# Patient Record
Sex: Male | Born: 1995 | Race: White | Hispanic: No | Marital: Single | Smoking: Former smoker
Health system: Southern US, Community
[De-identification: ages and names within clinical notes are randomized; demographics above are authoritative.]

## PROBLEM LIST (undated history)

## (undated) HISTORY — PX: ELBOW SURGERY: SHX618

---
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
## 2016-04-11 DIAGNOSIS — M25571 Pain in right ankle and joints of right foot: Secondary | ICD-10-CM | POA: Diagnosis not present

## 2016-04-11 DIAGNOSIS — M79671 Pain in right foot: Secondary | ICD-10-CM | POA: Diagnosis not present

## 2016-05-05 ENCOUNTER — Encounter: Payer: Self-pay | Admitting: Primary Care

## 2016-05-05 ENCOUNTER — Ambulatory Visit (INDEPENDENT_AMBULATORY_CARE_PROVIDER_SITE_OTHER): Payer: 59 | Admitting: Primary Care

## 2016-05-05 DIAGNOSIS — M79673 Pain in unspecified foot: Secondary | ICD-10-CM | POA: Insufficient documentation

## 2016-05-05 DIAGNOSIS — M79671 Pain in right foot: Secondary | ICD-10-CM | POA: Diagnosis not present

## 2016-05-05 NOTE — Progress Notes (Signed)
Pre visit review using our clinic review tool, if applicable. No additional management support is needed unless otherwise documented below in the visit note. 

## 2016-05-05 NOTE — Patient Instructions (Signed)
Your heel needs additional time to heal. Purchase some heel insoles to help cushion and protect your heels. Give this 2 more weeks and notify me if no improvement.  Start Ibuprofen. Take 600 mg three times daily as needed for pain and inflammation of your heel.  Nasal Congestion: Try using Flonase (fluticasone) nasal spray. Instill 1 spray in each nostril twice daily.   Cough: Try Delsym DM cough syrup. This will help with cough and any congestion.   Please notify me if you develop persistent fevers of 101, start coughing up green mucous, notice increased fatigue or weakness, or feel worse after 1 week of onset of symptoms.   Increase consumption of water intake and rest.  It was a pleasure to meet you today! Please don't hesitate to call me with any questions. Welcome to [facility]!
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
## 2016-06-09 DIAGNOSIS — H5213 Myopia, bilateral: Secondary | ICD-10-CM | POA: Diagnosis not present

## 2016-09-07 ENCOUNTER — Ambulatory Visit: Payer: 59 | Admitting: Primary Care

## 2017-02-05 DIAGNOSIS — H66001 Acute suppurative otitis media without spontaneous rupture of ear drum, right ear: Secondary | ICD-10-CM | POA: Diagnosis not present

## 2017-02-05 DIAGNOSIS — H60331 Swimmer's ear, right ear: Secondary | ICD-10-CM | POA: Diagnosis not present

## 2017-02-05 DIAGNOSIS — H6121 Impacted cerumen, right ear: Secondary | ICD-10-CM | POA: Diagnosis not present

## 2017-07-09 ENCOUNTER — Ambulatory Visit: Payer: Self-pay | Admitting: *Deleted

## 2017-07-09 NOTE — Telephone Encounter (Signed)
Pt reports red, raised area left groin. Area slightly larger than quarter size. Noted on Saturday 07/07/17. Tender "when bending" 3/10.  No drainage; states soft on top of affected area, hardened underneath. Only one present; no fever. Appt made by agent for Friday 07/13/17.  Reason for Disposition . Boil > 1/2 inch across (> 12 mm; larger than a marble)  Answer Assessment - Initial Assessment Questions 1. APPEARANCE of BOIL: "What does the boil look like?"      Red, warm to touch. "Soft at top, hardened area underneath." 2. LOCATION: "Where is the boil located?"      Left groin area 3. NUMBER: "How many boils are there?"      one 4. SIZE: "How big is the boil?" (e.g., inches, cm; compare to size of a coin or other object)     Slightly larger than quarter. 5. ONSET: "When did the boil start?"     Saturday 07/07/17 6. PAIN: "Is there any pain?" If so, ask: "How bad is the pain?"   (Scale 1-10; or mild, moderate, severe)     3/10 with bending only 7. FEVER: "Do you have a fever?" If so, ask: "What is it, how was it measured, and when did it start?"     NO 8. SOURCE: "Have you been around anyone with boils or other Staph infections?" "Have you ever had boils before?"    No 9. OTHER SYMPTOMS: "Do you have any other symptoms?" (e.g., shaking chills, weakness, rash elsewhere on body)     Mild cold symptoms  Protocols used: BOIL (SKIN ABSCESS)-A-AH

## 2017-07-13 ENCOUNTER — Ambulatory Visit (INDEPENDENT_AMBULATORY_CARE_PROVIDER_SITE_OTHER): Payer: Self-pay | Admitting: Family

## 2017-07-13 ENCOUNTER — Encounter: Payer: Self-pay | Admitting: Family

## 2017-07-13 ENCOUNTER — Ambulatory Visit: Payer: 59 | Admitting: Primary Care

## 2017-07-13 VITALS — BP 120/80 | HR 85 | Temp 98.4°F | Wt 250.0 lb

## 2017-07-13 DIAGNOSIS — B9689 Other specified bacterial agents as the cause of diseases classified elsewhere: Secondary | ICD-10-CM

## 2017-07-13 DIAGNOSIS — J019 Acute sinusitis, unspecified: Secondary | ICD-10-CM

## 2017-07-13 MED ORDER — AZITHROMYCIN 250 MG PO TABS: ORAL_TABLET | ORAL | 0 refills | Status: DC

## 2017-07-13 NOTE — Patient Instructions (Signed)

## 2017-07-13 NOTE — Progress Notes (Signed)
Subjective:     Patient ID: Justin Miles, male   DOB: 08/21/1995, 22 y.o.   MRN: [id number]  HPI 22 year old male is in today with c/o cough, green phlegm, congestion, sore throat x 1 week without relief. He vapes about 3 times a week. Brother sick with similar symptoms   Review of Systems  Constitutional: Positive for fatigue and fever.  HENT: Positive for postnasal drip, rhinorrhea and sore throat.   Respiratory: Positive for cough.   Cardiovascular: Negative.   Endocrine: Negative.   Musculoskeletal: Negative.   Skin: Negative.   Allergic/Immunologic: Negative.   Neurological: Negative.   Psychiatric/Behavioral: Negative.      History reviewed. No pertinent past medical history.  Social History   Socioeconomic History  . Marital status: Single    Spouse name: Not on file  . Number of children: Not on file  . Years of education: Not on file  . Highest education level: Not on file  Social Needs  . Financial resource strain: Not on file  . Food insecurity - worry: Not on file  . Food insecurity - inability: Not on file  . Transportation needs - medical: Not on file  . Transportation needs - non-medical: Not on file  Occupational History  . Not on file  Tobacco Use  . Smoking status: Light Tobacco Smoker  Substance and Sexual Activity  . Alcohol use: Yes  . Drug use: Not on file  . Sexual activity: Not on file  Other Topics Concern  . Not on file  Social History Narrative   Single.   No children.   Works at [employer].   Enjoys riding his 4-wheeler, golfing.        Past Surgical History:  Procedure Laterality Date  . ELBOW SURGERY Right     Family History  Problem Relation Age of Onset  . Diabetes Father   . Hypertension Father     No Known Allergies  No current outpatient medications on file prior to visit.   No current facility-administered medications on file prior to visit.     BP 120/80   Pulse 85   Temp 98.4 F (36.9 C)   Wt 250 lb  (113.4 kg)   SpO2 97%   BMI 35.87 kg/m chart    Objective:   Physical Exam     Assessment:     Justin Miles was seen today for choice-[place]-cold-fever/green mucus.  Diagnoses and all orders for this visit:  Acute bacterial sinusitis  Other orders -     azithromycin (ZITHROMAX) 250 MG tablet; 2 tabs po today, then 1 tab daily x 4 more days.      Plan:     Advised OTC sudafed. Call the office if symptoms worsen or persist

## 2017-12-06 DIAGNOSIS — H5213 Myopia, bilateral: Secondary | ICD-10-CM | POA: Diagnosis not present

## 2018-04-22 ENCOUNTER — Encounter (INDEPENDENT_AMBULATORY_CARE_PROVIDER_SITE_OTHER): Payer: Self-pay

## 2018-04-22 ENCOUNTER — Ambulatory Visit: Payer: 59 | Admitting: Primary Care

## 2018-04-22 VITALS — BP 122/72 | HR 68 | Temp 98.2°F | Ht 70.0 in | Wt 250.5 lb

## 2018-04-22 DIAGNOSIS — R0683 Snoring: Secondary | ICD-10-CM

## 2018-04-22 NOTE — Progress Notes (Signed)
Subjective:    Patient ID: Justin Miles, male    DOB: January 24, 1996, 22 y.o.   MRN: [id number]  HPI  Mr. Justin Miles is a 22 year old male who presents today with concerns for sleep apnea.   He is with his fiance today who reports loud snoring, periods of apnea, jerking movements during sleep. He does endorse waking up gasping for air sometimes, daytime drowsiness, takes naps daily, can easily fall asleep if sedentary. Symptoms have been present for the last one year. He denies chest pain, shortness of breath, nasal congestion, post nasal drip, rhinorrhea. He has a positive family history of sleep apnea in his father.   Review of Systems  HENT: Negative for congestion and postnasal drip.   Respiratory: Negative for shortness of breath.        Snoring, periods of apnea during sleep  Cardiovascular: Negative for chest pain.       No past medical history on file.   Social History   Socioeconomic History  . Marital status: Single    Spouse name: Not on file  . Number of children: Not on file  . Years of education: Not on file  . Highest education level: Not on file  Occupational History  . Not on file  Social Needs  . Financial resource strain: Not on file  . Food insecurity:    Worry: Not on file    Inability: Not on file  . Transportation needs:    Medical: Not on file    Non-medical: Not on file  Tobacco Use  . Smoking status: Light Tobacco Smoker  Substance and Sexual Activity  . Alcohol use: Yes  . Drug use: Not on file  . Sexual activity: Not on file  Lifestyle  . Physical activity:    Days per week: Not on file    Minutes per session: Not on file  . Stress: Not on file  Relationships  . Social connections:    Talks on phone: Not on file    Gets together: Not on file    Attends religious service: Not on file    Active member of club or organization: Not on file    Attends meetings of clubs or organizations: Not on file    Relationship status: Not on file  .  Intimate partner violence:    Fear of current or ex partner: Not on file    Emotionally abused: Not on file    Physically abused: Not on file    Forced sexual activity: Not on file  Other Topics Concern  . Not on file  Social History Narrative   Single.   No children.   Works at [employer].   Enjoys riding his 4-wheeler, golfing.        Past Surgical History:  Procedure Laterality Date  . ELBOW SURGERY Right     Family History  Problem Relation Age of Onset  . Diabetes Father   . Hypertension Father     No Known Allergies  No current outpatient medications on file prior to visit.   No current facility-administered medications on file prior to visit.     BP 122/72   Pulse 68   Temp 98.2 F (36.8 C) (Oral)   Ht 5\' 10"  (1.778 m)   Wt 250 lb 8 oz (113.6 kg)   SpO2 98%   BMI 35.94 kg/m    Objective:   Physical Exam  Constitutional: He appears well-nourished.  Neck: Neck supple.  Cardiovascular: Normal rate and regular rhythm.  Respiratory: Effort normal and breath sounds normal.  Skin: Skin is warm and dry.           Assessment & Plan:

## 2018-04-22 NOTE — Assessment & Plan Note (Signed)
Also with periods of apnea, jerking movements, daytime drowsiness. Epworth Sleepiness Score of 8 today. Given family history of sleep apnea in father will send to pulmonology for sleep study.

## 2018-04-22 NOTE — Patient Instructions (Addendum)
You will be contacted regarding your referral to pulmonology for the sleep study.  Please let us know if you have not been contacted within one week.   Start exercising. You should be getting 150 minutes of moderate intensity exercise weekly.  It's important to improve your diet by reducing consumption of fast food, fried food, processed snack foods, sugary drinks. Increase consumption of fresh vegetables and fruits, whole grains, water.  Ensure you are drinking 64 ounces of water daily.  It was a pleasure to see you today!

## 2018-05-07 ENCOUNTER — Ambulatory Visit (INDEPENDENT_AMBULATORY_CARE_PROVIDER_SITE_OTHER): Payer: 59 | Admitting: Internal Medicine

## 2018-05-07 ENCOUNTER — Encounter: Payer: Self-pay | Admitting: Internal Medicine

## 2018-05-07 VITALS — BP 116/70 | HR 64 | Resp 16 | Ht 70.0 in | Wt 257.0 lb

## 2018-05-07 DIAGNOSIS — G4719 Other hypersomnia: Secondary | ICD-10-CM

## 2018-05-07 NOTE — Patient Instructions (Signed)

## 2018-05-07 NOTE — Progress Notes (Signed)
[facility] [place] [facility] Medicine Consultation      Assessment and Plan:  Excessive daytime sleepiness. -Symptoms and signs of obstructive sleep apnea. -We will send for sleep study.  Loud snoring. - Discussed with patient, his sleep study negative for obstructive sleep apnea can consider a antisnoring device.  Orders Placed This Encounter  Procedures  . Home sleep test   Return in about 3 months (around 08/07/2018).     Date: 05/07/2018  MRN# [id number] Justin Miles 05-17-1996    Justin Miles is a 22 y.o. old male seen in consultation for chief complaint of:    Chief Complaint  Patient presents with  . Consult    Referred by Vernona Rieger eval of OSA:  . Snoring  . daytime sleepiness    HPI:   He is here with his fiance, he is snoring loudly at night, and gasping for air. This has been doing on for about 2-3 years.  He is sleepy during the day, esp after lunch and towards the end of the day.  He goes to bed at 9-MN, wakes at 6 am, and feels rested in the am. Sleeps until 830 on weekends, but still sleepy on those days. He works doing maintenance.  Denies sleep paralysis or cataplexy. Denies TMJ, denies jaw pain.  His dad has a cpap for OSA.    PMHX:   History reviewed. No pertinent past medical history. Surgical Hx:  Past Surgical History:  Procedure Laterality Date  . ELBOW SURGERY Right    Family Hx:  Family History  Problem Relation Age of Onset  . Diabetes Father   . Hypertension Father    Social Hx:   Social History   Tobacco Use  . Smoking status: Light Tobacco Smoker  . Smokeless tobacco: Never Used  Substance Use Topics  . Alcohol use: Yes  . Drug use: Never   Medication:   No current outpatient medications on file.   Allergies:  Patient has no known allergies.  Review of Systems: Gen:  Denies  fever, sweats, chills HEENT: Denies blurred vision, double vision. bleeds, sore throat Cvc:  No dizziness, chest pain. Resp:   Denies  cough or sputum production, shortness of breath Gi: Denies swallowing difficulty, stomach pain. Gu:  Denies bladder incontinence, burning urine Ext:   No Joint pain, stiffness. Skin: No skin rash,  hives  Endoc:  No polyuria, polydipsia. Psych: No depression, insomnia. Other:  All other systems were reviewed with the patient and were negative other that what is mentioned in the HPI.   Physical Examination:   VS: BP 116/70 (BP Location: Left Arm, Cuff Size: Large)   Pulse 64   Resp 16   Ht 5\' 10"  (1.778 m)   Wt 257 lb (116.6 kg)   SpO2 97%   BMI 36.88 kg/m   General Appearance: No distress  Neuro:without focal findings,  speech normal,  HEENT: PERRLA, EOM intact.  Mallampati 3. Pulmonary: normal breath sounds, No wheezing.  CardiovascularNormal S1,S2.  No m/r/g.   Abdomen: Benign, Soft, non-tender. Renal:  No costovertebral tenderness  GU:  No performed at this time. Endoc: No evident thyromegaly, no signs of acromegaly. Skin:   warm, no rashes, no ecchymosis  Extremities: normal, no cyanosis, clubbing.  Other findings:    LABORATORY PANEL:   CBC No results for input(s): WBC, HGB, HCT, PLT in the last 168 hours. ------------------------------------------------------------------------------------------------------------------  Chemistries  No results for input(s): NA, K, CL, CO2, GLUCOSE, BUN, CREATININE, CALCIUM, MG, AST,  ALT, ALKPHOS, BILITOT in the last 168 hours.  Invalid input(s): GFRCGP ------------------------------------------------------------------------------------------------------------------  Cardiac Enzymes No results for input(s): TROPONINI in the last 168 hours. ------------------------------------------------------------  RADIOLOGY:  No results found.     Thank  you for the consultation and for allowing [facility] to assist in the care of your patient. Our recommendations are noted above.  Please contact us if we can  be of further service.   Wells Guiles, M.D., F.C.C.P.  Board Certified in Internal Medicine, Pulmonary Medicine, Critical Care Medicine, and Sleep Medicine.  [facility] Office Number: [phone]   05/07/2018

## 2018-05-27 ENCOUNTER — Telehealth: Payer: Self-pay | Admitting: Internal Medicine

## 2018-07-11 DIAGNOSIS — G4733 Obstructive sleep apnea (adult) (pediatric): Secondary | ICD-10-CM | POA: Diagnosis not present

## 2018-07-17 DIAGNOSIS — G4733 Obstructive sleep apnea (adult) (pediatric): Secondary | ICD-10-CM | POA: Diagnosis not present

## 2018-07-18 ENCOUNTER — Telehealth: Payer: Self-pay

## 2018-07-18 DIAGNOSIS — G4719 Other hypersomnia: Secondary | ICD-10-CM

## 2018-07-18 DIAGNOSIS — G4733 Obstructive sleep apnea (adult) (pediatric): Secondary | ICD-10-CM

## 2018-07-18 NOTE — Telephone Encounter (Signed)
LM on VM for patient to call for sleep study results.   Mild OSA with AHI 11 Recommend auto-CPAP with pressure range of 5-15 cm H2O.

## 2018-07-19 NOTE — Telephone Encounter (Signed)
Pt is aware of results and voiced his understanding. Pt wishes to proceed with cpap therapy. Order has been placed for cpap. Pt has been scheduled for OV on 10/01/18 for compliance.  Nothing further is needed.

## 2018-07-24 NOTE — Addendum Note (Signed)
Addended by: Maxwell Marion A on: 07/24/2018 01:28 PM   Modules accepted: Orders

## 2018-09-19 NOTE — Telephone Encounter (Signed)
Closing encounter

## 2018-09-27 ENCOUNTER — Telehealth: Payer: Self-pay | Admitting: Internal Medicine

## 2018-09-27 NOTE — Telephone Encounter (Signed)
LM on VM. Did he get his CPAP set up? Per [employer], they have tried to reach him without success. If CPAP not set up, we can cx f/u apt until this is completed.

## 2018-09-30 ENCOUNTER — Telehealth: Payer: Self-pay

## 2018-09-30 NOTE — Telephone Encounter (Signed)
Patient was never set up on CPAP. He cx f/u apt. Is not able to afford it at this time.

## 2018-09-30 NOTE — Telephone Encounter (Signed)
Spoke to patient, cx apt. At this time he cannot afford cpap.

## 2018-10-01 ENCOUNTER — Ambulatory Visit: Payer: 59 | Admitting: Internal Medicine

## 2019-01-21 DIAGNOSIS — Z20828 Contact with and (suspected) exposure to other viral communicable diseases: Secondary | ICD-10-CM | POA: Diagnosis not present

## 2019-02-12 DIAGNOSIS — Z20828 Contact with and (suspected) exposure to other viral communicable diseases: Secondary | ICD-10-CM | POA: Diagnosis not present

## 2019-02-20 DIAGNOSIS — Z20828 Contact with and (suspected) exposure to other viral communicable diseases: Secondary | ICD-10-CM | POA: Diagnosis not present

## 2019-09-01 DIAGNOSIS — H5213 Myopia, bilateral: Secondary | ICD-10-CM | POA: Diagnosis not present

## 2020-06-07 ENCOUNTER — Other Ambulatory Visit: Payer: Self-pay

## 2020-07-26 DIAGNOSIS — Z03818 Encounter for observation for suspected exposure to other biological agents ruled out: Secondary | ICD-10-CM | POA: Diagnosis not present

## 2020-07-26 DIAGNOSIS — Z20822 Contact with and (suspected) exposure to covid-19: Secondary | ICD-10-CM | POA: Diagnosis not present

## 2020-08-06 ENCOUNTER — Telehealth: Payer: Self-pay | Admitting: General Practice

## 2020-08-06 NOTE — Telephone Encounter (Signed)
Left message to have his hearing test done.

## 2020-08-09 ENCOUNTER — Ambulatory Visit: Payer: Self-pay

## 2020-08-09 ENCOUNTER — Other Ambulatory Visit: Payer: Self-pay

## 2020-08-09 DIAGNOSIS — Z0000 Encounter for general adult medical examination without abnormal findings: Secondary | ICD-10-CM

## 2020-08-09 LAB — POCT URINALYSIS DIPSTICK
Bilirubin, UA: NEGATIVE
Blood, UA: NEGATIVE
Glucose, UA: NEGATIVE
Ketones, UA: NEGATIVE
Leukocytes, UA: NEGATIVE
Nitrite, UA: NEGATIVE
Protein, UA: NEGATIVE
Spec Grav, UA: 1.025 (ref 1.010–1.025)
Urobilinogen, UA: 0.2 E.U./dL
pH, UA: 6 (ref 5.0–8.0)

## 2020-08-09 NOTE — Progress Notes (Signed)
Works in [employer].  Position requires an annual hearing screen.  Justin Miles was hired in 06/2020  This is his baseline hearing screen.  Scheduled to complete physical 08/17/2020 with Dr. Pricilla Handler.  AMD

## 2020-08-10 LAB — CMP12+LP+TP+TSH+6AC+CBC/D/PLT
ALT: 27 IU/L (ref 0–44)
AST: 26 IU/L (ref 0–40)
Albumin/Globulin Ratio: 2.1 (ref 1.2–2.2)
Albumin: 4.7 g/dL (ref 4.1–5.2)
Alkaline Phosphatase: 61 IU/L (ref 44–121)
BUN/Creatinine Ratio: 15 (ref 9–20)
BUN: 14 mg/dL (ref 6–20)
Basophils Absolute: 0.1 10*3/uL (ref 0.0–0.2)
Basos: 1 %
Bilirubin Total: 0.6 mg/dL (ref 0.0–1.2)
Calcium: 9.6 mg/dL (ref 8.7–10.2)
Chloride: 104 mmol/L (ref 96–106)
Chol/HDL Ratio: 4.3 ratio (ref 0.0–5.0)
Cholesterol, Total: 129 mg/dL (ref 100–199)
Creatinine, Ser: 0.91 mg/dL (ref 0.76–1.27)
EOS (ABSOLUTE): 0 10*3/uL (ref 0.0–0.4)
Eos: 1 %
Estimated CHD Risk: 0.8 times avg. (ref 0.0–1.0)
Free Thyroxine Index: 2.2 (ref 1.2–4.9)
GFR calc Af Amer: 136 mL/min/{1.73_m2} (ref >59)
GFR calc non Af Amer: 118 mL/min/{1.73_m2} (ref >59)
GGT: 23 IU/L (ref 0–65)
Globulin, Total: 2.2 g/dL (ref 1.5–4.5)
Glucose: 77 mg/dL (ref 65–99)
HDL: 30 mg/dL — ABNORMAL LOW (ref >39)
Hematocrit: 48.3 % (ref 37.5–51.0)
Hemoglobin: 16.2 g/dL (ref 13.0–17.7)
Immature Grans (Abs): 0 10*3/uL (ref 0.0–0.1)
Immature Granulocytes: 0 %
Iron: 139 ug/dL (ref 38–169)
LDH: 201 IU/L (ref 121–224)
LDL Chol Calc (NIH): 70 mg/dL (ref 0–99)
Lymphocytes Absolute: 2.7 10*3/uL (ref 0.7–3.1)
Lymphs: 35 %
MCH: 28.1 pg (ref 26.6–33.0)
MCHC: 33.5 g/dL (ref 31.5–35.7)
MCV: 84 fL (ref 79–97)
Monocytes Absolute: 0.6 10*3/uL (ref 0.1–0.9)
Monocytes: 8 %
Neutrophils Absolute: 4.2 10*3/uL (ref 1.4–7.0)
Neutrophils: 55 %
Phosphorus: 3.2 mg/dL (ref 2.8–4.1)
Platelets: 387 10*3/uL (ref 150–450)
Potassium: 4.7 mmol/L (ref 3.5–5.2)
RBC: 5.76 x10E6/uL (ref 4.14–5.80)
RDW: 13 % (ref 11.6–15.4)
Sodium: 141 mmol/L (ref 134–144)
T3 Uptake Ratio: 28 % (ref 24–39)
T4, Total: 7.7 ug/dL (ref 4.5–12.0)
TSH: 1.17 u[IU]/mL (ref 0.450–4.500)
Total Protein: 6.9 g/dL (ref 6.0–8.5)
Triglycerides: 167 mg/dL — ABNORMAL HIGH (ref 0–149)
Uric Acid: 5.8 mg/dL (ref 3.8–8.4)
VLDL Cholesterol Cal: 29 mg/dL (ref 5–40)
WBC: 7.6 10*3/uL (ref 3.4–10.8)

## 2020-08-17 ENCOUNTER — Encounter: Payer: Self-pay | Admitting: Adult Medicine

## 2021-03-22 DIAGNOSIS — H5213 Myopia, bilateral: Secondary | ICD-10-CM | POA: Diagnosis not present

## 2021-08-16 ENCOUNTER — Ambulatory Visit: Payer: Self-pay

## 2021-08-23 ENCOUNTER — Encounter: Payer: Self-pay | Admitting: Physician Assistant

## 2021-08-25 NOTE — Progress Notes (Signed)
08/30/21 annual physical scheduled. °

## 2021-08-26 ENCOUNTER — Ambulatory Visit: Payer: Self-pay

## 2021-08-26 ENCOUNTER — Other Ambulatory Visit: Payer: Self-pay

## 2021-08-26 DIAGNOSIS — Z0000 Encounter for general adult medical examination without abnormal findings: Secondary | ICD-10-CM

## 2021-08-26 LAB — POCT URINALYSIS DIPSTICK
Appearance: NORMAL
Bilirubin, UA: NEGATIVE
Blood, UA: NEGATIVE
Glucose, UA: NEGATIVE
Ketones, UA: NEGATIVE
Leukocytes, UA: NEGATIVE
Nitrite, UA: NEGATIVE
Protein, UA: NEGATIVE
Spec Grav, UA: 1.01 (ref 1.010–1.025)
Urobilinogen, UA: 0.2 E.U./dL
pH, UA: 7.5 (ref 5.0–8.0)

## 2021-08-26 NOTE — Progress Notes (Signed)
Kaydn works for [employer].  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   AMD

## 2021-08-27 LAB — CMP12+LP+TP+TSH+6AC+CBC/D/PLT
ALT: 29 IU/L (ref 0–44)
AST: 23 IU/L (ref 0–40)
Albumin/Globulin Ratio: 2 (ref 1.2–2.2)
Albumin: 4.7 g/dL (ref 4.1–5.2)
Alkaline Phosphatase: 55 IU/L (ref 44–121)
BUN/Creatinine Ratio: 17 (ref 9–20)
BUN: 15 mg/dL (ref 6–20)
Basophils Absolute: 0.1 10*3/uL (ref 0.0–0.2)
Basos: 1 %
Bilirubin Total: 0.8 mg/dL (ref 0.0–1.2)
Calcium: 9.5 mg/dL (ref 8.7–10.2)
Chloride: 104 mmol/L (ref 96–106)
Chol/HDL Ratio: 3.6 ratio (ref 0.0–5.0)
Cholesterol, Total: 131 mg/dL (ref 100–199)
Creatinine, Ser: 0.9 mg/dL (ref 0.76–1.27)
EOS (ABSOLUTE): 0 10*3/uL (ref 0.0–0.4)
Eos: 1 %
Estimated CHD Risk: 0.6 times avg. (ref 0.0–1.0)
Free Thyroxine Index: 2 (ref 1.2–4.9)
GGT: 28 IU/L (ref 0–65)
Globulin, Total: 2.4 g/dL (ref 1.5–4.5)
Glucose: 88 mg/dL (ref 70–99)
HDL: 36 mg/dL — ABNORMAL LOW (ref >39)
Hematocrit: 49.6 % (ref 37.5–51.0)
Hemoglobin: 16.8 g/dL (ref 13.0–17.7)
Immature Grans (Abs): 0 10*3/uL (ref 0.0–0.1)
Immature Granulocytes: 0 %
Iron: 107 ug/dL (ref 38–169)
LDH: 159 IU/L (ref 121–224)
LDL Chol Calc (NIH): 79 mg/dL (ref 0–99)
Lymphocytes Absolute: 2.4 10*3/uL (ref 0.7–3.1)
Lymphs: 38 %
MCH: 28.2 pg (ref 26.6–33.0)
MCHC: 33.9 g/dL (ref 31.5–35.7)
MCV: 83 fL (ref 79–97)
Monocytes Absolute: 0.5 10*3/uL (ref 0.1–0.9)
Monocytes: 7 %
Neutrophils Absolute: 3.4 10*3/uL (ref 1.4–7.0)
Neutrophils: 53 %
Phosphorus: 3.1 mg/dL (ref 2.8–4.1)
Platelets: 368 10*3/uL (ref 150–450)
Potassium: 4.6 mmol/L (ref 3.5–5.2)
RBC: 5.95 x10E6/uL — ABNORMAL HIGH (ref 4.14–5.80)
RDW: 12.9 % (ref 11.6–15.4)
Sodium: 141 mmol/L (ref 134–144)
T3 Uptake Ratio: 30 % (ref 24–39)
T4, Total: 6.8 ug/dL (ref 4.5–12.0)
TSH: 1 u[IU]/mL (ref 0.450–4.500)
Total Protein: 7.1 g/dL (ref 6.0–8.5)
Triglycerides: 81 mg/dL (ref 0–149)
Uric Acid: 6 mg/dL (ref 3.8–8.4)
VLDL Cholesterol Cal: 16 mg/dL (ref 5–40)
WBC: 6.4 10*3/uL (ref 3.4–10.8)
eGFR: 122 mL/min/{1.73_m2} (ref >59)

## 2021-08-30 ENCOUNTER — Ambulatory Visit: Payer: Self-pay | Admitting: Physician Assistant

## 2021-08-30 ENCOUNTER — Encounter: Payer: Self-pay | Admitting: Physician Assistant

## 2021-08-30 ENCOUNTER — Other Ambulatory Visit: Payer: Self-pay

## 2021-08-30 VITALS — BP 129/76 | HR 76 | Temp 97.6°F | Resp 14 | Ht 69.0 in | Wt 245.0 lb

## 2021-08-30 DIAGNOSIS — Z0000 Encounter for general adult medical examination without abnormal findings: Secondary | ICD-10-CM

## 2021-08-30 NOTE — Progress Notes (Signed)
Pt denies any issues or concerns at this time/CL,RMA ?

## 2021-08-30 NOTE — Progress Notes (Signed)
[facility]  ____________________________________________   None    (approximate)  I have reviewed the triage vital signs and the nursing notes.   HISTORY  Chief Complaint Annual Exam    HPI Justin Miles is a 26 y.o. male patient presents for annual physical exam voices no concerns or complaints.        History reviewed. No pertinent past medical history.  Patient Active Problem List   Diagnosis Date Noted   Snoring 04/22/2018    Past Surgical History:  Procedure Laterality Date   ELBOW SURGERY Right     Prior to Admission medications   Not on File    Allergies Patient has no known allergies.  Family History  Problem Relation Age of Onset   Diabetes Father    Hypertension Father     Social History Social History   Tobacco Use   Smoking status: Former    Types: Cigarettes   Smokeless tobacco: Never  Vaping Use   Vaping Use: Former  Substance Use Topics   Alcohol use: Yes   Drug use: Never    Review of Systems Constitutional: No fever/chills Eyes: No visual changes. ENT: No sore throat. Cardiovascular: Denies chest pain. Respiratory: Denies shortness of breath. Gastrointestinal: No abdominal pain.  No nausea, no vomiting.  No diarrhea.  No constipation. Genitourinary: Negative for dysuria. Musculoskeletal: Negative for back pain. Skin: Negative for rash. Neurological: Negative for headaches, focal weakness or numbness. ____________________________________________   PHYSICAL EXAM:   VITAL SIGNS: Temperature 97.6, pulse 76, respiration 14, BP is 129/76, and patient 95% O2 sat on room air.  Patient weighs 245 pounds and BMI is 36.18. Constitutional: Alert and oriented. Well appearing and in no acute distress. Eyes: Conjunctivae are normal. PERRL. EOMI. Head: Atraumatic. Nose: No congestion/rhinnorhea. Mouth/Throat: Mucous membranes are moist.  Oropharynx non-erythematous. Neck: No stridor.  No cervical spine tenderness  to palpation. Hematological/Lymphatic/Immunilogical: No cervical lymphadenopathy. Cardiovascular: Normal rate, regular rhythm. Grossly normal heart sounds.  Good peripheral circulation. Respiratory: Normal respiratory effort.  No retractions. Lungs CTAB. Gastrointestinal: Soft and nontender. No distention. No abdominal bruits. No CVA tenderness. Genitourinary: Deferred Musculoskeletal: No lower extremity tenderness nor edema.  No joint effusions. Neurologic:  Normal speech and language. No gross focal neurologic deficits are appreciated. No gait instability. Skin:  Skin is warm, dry and intact. No rash noted. Psychiatric: Mood and affect are normal. Speech and behavior are normal.  ____________________________________________   LABS  __      Component Ref Range & Units 4 d ago 1 yr ago  Color, UA  yellow  Dark Yellow   Clarity, UA  clear  Clear   Glucose, UA Negative Negative  Negative   Bilirubin, UA  negative  Negative   Ketones, UA  negatibe  Negative   Spec Grav, UA 1.010 - 1.025 1.010  1.025   Blood, UA  negative  Negative   pH, UA 5.0 - 8.0 7.5  6.0   Protein, UA Negative Negative  Negative   Urobilinogen, UA 0.2 or 1.0 E.U./dL 0.2  0.2   Nitrite, UA  negative  Negative   Leukocytes, UA Negative Negative  Negative   Appearance  normal     Odor             Specimen Collected: 08/26/21 09:17 Last Resulted: 08/26/21 09:17      Lab Flowsheet    Order Details    View Encounter    Lab and Collection Details  Routing    Result History    Brewing technologist        Result Care Coordination   Patient Communication   Add Comments   Add Notifications  Back to Top       Other Results from 08/26/2021   Contains abnormal data CMP12+LP+TP+TSH+6AC+CBC/D/Plt Order: [id number] Status: Final result    Visible to patient: Yes (not seen)    Next appt: None    Dx: Routine adult health maintenance    0 Result Notes      Component Ref Range & Units 4 d ago 1 yr  ago  Glucose 70 - 99 mg/dL 88  77 R   Uric Acid 3.8 - 8.4 mg/dL 6.0  5.8 CM   Comment:            Therapeutic target for gout patients: <6.0  BUN 6 - 20 mg/dL 15  14   Creatinine, Ser 0.76 - 1.27 mg/dL 0.90  0.91   eGFR >59 mL/min/1.73 122    BUN/Creatinine Ratio 9 - _0 Sodium 134 - 144 mmol/L 141  141   Potassium 3.5 - 5.2 mmol/L 4.6  4.7   Chloride 96 - 106 mmol/L 104  104   Calcium 8.7 - 10.2 mg/dL 9.5  9.6   Phosphorus 2.8 - 4.1 mg/dL 3.1  3.2   Total Protein 6.0 - 8.5 g/dL 7.1  6.9   Albumin 4.1 - 5.2 g/dL 4.7  4.7   Globulin, Total 1.5 - 4.5 g/dL 2.4  2.2   Albumin/Globulin Ratio 1.2 - 2.2 2.0  2.1   Bilirubin Total 0.0 - 1.2 mg/dL 0.8  0.6   Alkaline Phosphatase 44 - 121 IU/L 55  61   LDH 121 - 224 IU/L 159  201   AST 0 - 40 IU/L 23  26   ALT 0 - 44 IU/L 29  27   GGT 0 - 65 IU/L 28  23   Iron 38 - 169 ug/dL 107  139   Cholesterol, Total 100 - 199 mg/dL 131  129   Triglycerides 0 - 149 mg/dL 81  167 High    HDL >39 mg/dL 36 Low   30 Low    VLDL Cholesterol Cal 5 - 40 mg/dL 16  29   LDL Chol Calc (NIH) 0 - 99 mg/dL 79  70   Chol/HDL Ratio 0.0 - 5.0 ratio 3.6  4.3 CM   Comment:                                   T. Chol/HDL Ratio                                              Men  Women                                1/2 Avg.Risk  3.4    3.3                                    Avg.Risk  5.0    4.4  2X Avg.Risk  9.6    7.1                                 3X Avg.Risk 23.4   11.0   Estimated CHD Risk 0.0 - 1.0 times avg. 0.6  0.8 CM   Comment: The CHD Risk is based on the T. Chol/HDL ratio. Other  factors affect CHD Risk such as hypertension, smoking,  diabetes, severe obesity, and family history of  premature CHD.   TSH 0.450 - 4.500 uIU/mL 1.000  1.170   T4, Total 4.5 - 12.0 ug/dL 6.8  7.7   T3 Uptake Ratio 24 - 39 % 30  28   Free Thyroxine Index 1.2 - 4.9 2.0  2.2   WBC 3.4 - 10.8 x10E3/uL 6.4  7.6   RBC 4.14 - 5.80 x10E6/uL 5.95  High   5.76   Hemoglobin 13.0 - 17.7 g/dL 16.8  16.2   Hematocrit 37.5 - 51.0 % 49.6  48.3   MCV 79 - 97 fL 83  84   MCH 26.6 - 33.0 pg 28.2  28.1   MCHC 31.5 - 35.7 g/dL 33.9  33.5   RDW 11.6 - 15.4 % 12.9  13.0   Platelets 150 - 450 x10E3/uL 368  387   Neutrophils Not Estab. % 53  55   Lymphs Not Estab. % 38  35   Monocytes Not Estab. % 7  8   Eos Not Estab. % 1  1   Basos Not Estab. % 1  1   Neutrophils Absolute 1.4 - 7.0 x10E3/uL 3.4  4.2   Lymphocytes Absolute 0.7 - 3.1 x10E3/uL 2.4  2.7   Monocytes Absolute 0.1 - 0.9 x10E3/uL 0.5  0.6   EOS (ABSOLUTE) 0.0 - 0.4 x10E3/uL 0.0  0.0   Basophils Absolute 0.0 - 0.2 x10E3/uL 0.1  0.1   Immature Granulocytes Not Estab. % 0  0          __________________________________________     ____________________________________________    ____________________________________________   INITIAL IMPRESSION / ASSESSMENT   As part of my medical decision making, I reviewed the following data within the electronic MEDICAL RECORD NUMBER      Discussed lab results with patient.        ____________________________________________   FINAL CLINICAL IMPRESSION(S)  Well exam.  ED Discharge Orders     None        Note:  This document was prepared using Dragon voice recognition software and may include unintentional dictation errors.

## 2022-01-02 ENCOUNTER — Ambulatory Visit: Payer: Self-pay | Admitting: Physician Assistant

## 2022-01-02 ENCOUNTER — Encounter: Payer: Self-pay | Admitting: Physician Assistant

## 2022-01-02 DIAGNOSIS — H1089 Other conjunctivitis: Secondary | ICD-10-CM

## 2022-01-02 MED ORDER — GENTAMICIN SULFATE 0.3 % OP SOLN: 1.0000 [drp] | OPHTHALMIC | 0 refills | Status: DC

## 2022-01-02 NOTE — Progress Notes (Signed)
   Subjective: Conjunctivitis    Patient ID: Justin Miles, male    DOB: 03/21/1996, 26 y.o.   MRN: [id number]  HPI Patient presents with matted eyelids annual screenings bilateral eye discharge.  Patient wears contact lenses and did not have other corrective eyewear.  Denies vision disturbance.   Review of Systems Negative except for chief complaint    Objective:   Physical Exam  BP is 144/80, pulse 71, respirations 16, temperature 98.6, patient 97% O2 sat on room air. Patient currently was not taking. Examination of the eyes shows bilateral erythematous conjunctiva and dry secretions.      Assessment & Plan: Conjunctivitis  Patient given discharge care instruction work note.  Patient advised use eyedrops as directed and follow-up in 3 days if no improvement or worsening complaints.

## 2022-01-15 ENCOUNTER — Encounter (HOSPITAL_COMMUNITY): Payer: Self-pay

## 2022-01-15 DIAGNOSIS — K047 Periapical abscess without sinus: Secondary | ICD-10-CM

## 2022-08-16 ENCOUNTER — Ambulatory Visit: Payer: Self-pay

## 2022-08-16 DIAGNOSIS — Z0000 Encounter for general adult medical examination without abnormal findings: Secondary | ICD-10-CM

## 2022-08-16 LAB — POCT URINALYSIS DIPSTICK
Bilirubin, UA: NEGATIVE
Blood, UA: NEGATIVE
Glucose, UA: NEGATIVE
Ketones, UA: NEGATIVE
Leukocytes, UA: NEGATIVE
Nitrite, UA: NEGATIVE
Protein, UA: NEGATIVE
Spec Grav, UA: >=1.03 — AB (ref 1.010–1.025)
Urobilinogen, UA: 0.2 E.U./dL
pH, UA: 6 (ref 5.0–8.0)

## 2022-08-16 NOTE — Progress Notes (Signed)
Pt presents today to complete lab portion of physical and hearing screen. Justin Miles

## 2022-08-17 LAB — CMP12+LP+TP+TSH+6AC+CBC/D/PLT
ALT: 43 IU/L (ref 0–44)
AST: 28 IU/L (ref 0–40)
Albumin/Globulin Ratio: 2 (ref 1.2–2.2)
Albumin: 4.4 g/dL (ref 4.3–5.2)
Alkaline Phosphatase: 55 IU/L (ref 44–121)
BUN/Creatinine Ratio: 21 — ABNORMAL HIGH (ref 9–20)
BUN: 16 mg/dL (ref 6–20)
Basophils Absolute: 0 10*3/uL (ref 0.0–0.2)
Basos: 1 %
Bilirubin Total: 0.4 mg/dL (ref 0.0–1.2)
Calcium: 9.1 mg/dL (ref 8.7–10.2)
Chloride: 106 mmol/L (ref 96–106)
Chol/HDL Ratio: 3.1 ratio (ref 0.0–5.0)
Cholesterol, Total: 88 mg/dL — ABNORMAL LOW (ref 100–199)
Creatinine, Ser: 0.78 mg/dL (ref 0.76–1.27)
EOS (ABSOLUTE): 0 10*3/uL (ref 0.0–0.4)
Eos: 1 %
Estimated CHD Risk: <0.5 times avg. (ref 0.0–1.0)
Free Thyroxine Index: 2.1 (ref 1.2–4.9)
GGT: 30 IU/L (ref 0–65)
Globulin, Total: 2.2 g/dL (ref 1.5–4.5)
Glucose: 93 mg/dL (ref 70–99)
HDL: 28 mg/dL — ABNORMAL LOW (ref >39)
Hematocrit: 45.4 % (ref 37.5–51.0)
Hemoglobin: 15.2 g/dL (ref 13.0–17.7)
Immature Grans (Abs): 0 10*3/uL (ref 0.0–0.1)
Immature Granulocytes: 0 %
Iron: 72 ug/dL (ref 38–169)
LDH: 150 IU/L (ref 121–224)
LDL Chol Calc (NIH): 48 mg/dL (ref 0–99)
Lymphocytes Absolute: 2.1 10*3/uL (ref 0.7–3.1)
Lymphs: 39 %
MCH: 28.7 pg (ref 26.6–33.0)
MCHC: 33.5 g/dL (ref 31.5–35.7)
MCV: 86 fL (ref 79–97)
Monocytes Absolute: 0.6 10*3/uL (ref 0.1–0.9)
Monocytes: 11 %
Neutrophils Absolute: 2.5 10*3/uL (ref 1.4–7.0)
Neutrophils: 48 %
Phosphorus: 3.3 mg/dL (ref 2.8–4.1)
Platelets: 325 10*3/uL (ref 150–450)
Potassium: 4.4 mmol/L (ref 3.5–5.2)
RBC: 5.3 x10E6/uL (ref 4.14–5.80)
RDW: 13 % (ref 11.6–15.4)
Sodium: 142 mmol/L (ref 134–144)
T3 Uptake Ratio: 30 % (ref 24–39)
T4, Total: 7 ug/dL (ref 4.5–12.0)
TSH: 1.14 u[IU]/mL (ref 0.450–4.500)
Total Protein: 6.6 g/dL (ref 6.0–8.5)
Triglycerides: 49 mg/dL (ref 0–149)
Uric Acid: 5.5 mg/dL (ref 3.8–8.4)
VLDL Cholesterol Cal: 12 mg/dL (ref 5–40)
WBC: 5.2 10*3/uL (ref 3.4–10.8)
eGFR: 126 mL/min/{1.73_m2} (ref >59)

## 2022-08-23 ENCOUNTER — Encounter: Payer: Self-pay | Admitting: Physician Assistant

## 2022-08-23 ENCOUNTER — Ambulatory Visit: Payer: Self-pay | Admitting: Physician Assistant

## 2022-08-23 VITALS — BP 126/79 | HR 71 | Temp 97.6°F | Resp 14 | Ht 69.0 in | Wt 225.0 lb

## 2022-08-23 DIAGNOSIS — Z0000 Encounter for general adult medical examination without abnormal findings: Secondary | ICD-10-CM

## 2022-08-23 NOTE — Progress Notes (Signed)
Pt presents today to complete physical. Pt states a bum on his mid back has been bothering him for a couple of months./CL,RMA

## 2022-08-23 NOTE — Progress Notes (Signed)
[facility]  ____________________________________________   None    (approximate)  I have reviewed the triage vital signs and the nursing notes.   HISTORY  Chief Complaint Annual Exam   HPI Justin Miles is a 27 y.o. male patient presents for annual physical exam.  Patient voiced concern for lesion left upper back.        No past medical history on file.  Patient Active Problem List   Diagnosis Date Noted   Other conjunctivitis 01/02/2022   Snoring 04/22/2018    Past Surgical History:  Procedure Laterality Date   ELBOW SURGERY Right     Prior to Admission medications   Medication Sig Start Date End Date Taking? Authorizing Provider  gentamicin (GARAMYCIN) 0.3 % ophthalmic solution Place 1 drop into both eyes every 4 (four) hours. 01/02/22   Sable Feil, PA-C    Allergies Patient has no known allergies.  Family History  Problem Relation Age of Onset   Diabetes Father    Hypertension Father     Social History Social History   Tobacco Use   Smoking status: Former    Types: Cigarettes   Smokeless tobacco: Never  Vaping Use   Vaping Use: Former  Substance Use Topics   Alcohol use: Yes   Drug use: Never    Review of Systems Constitutional: No fever/chills Eyes: No visual changes. ENT: No sore throat. Cardiovascular: Denies chest pain. Respiratory: Denies shortness of breath. Gastrointestinal: No abdominal pain.  No nausea, no vomiting.  No diarrhea.  No constipation. Genitourinary: Negative for dysuria. Musculoskeletal: Negative for back pain. Skin: Negative for rash. Neurological: Negative for headaches, focal weakness or numbness.  ____________________________________________   PHYSICAL EXAM:  VITAL SIGNS: BP is 126/79, pulse 71, respiration 14, temperature 97.6, patient 97% O2 sat on room air.  Patient weighs 225 pounds and BMI is 33.23. Constitutional: Alert and oriented. Well appearing and in no  acute distress. Eyes: Conjunctivae are normal. PERRL. EOMI. Head: Atraumatic. Nose: No congestion/rhinnorhea. EARS: Bilateral cerumen impaction. Mouth/Throat: Mucous membranes are moist.  Oropharynx non-erythematous. Neck: No stridor.  No cervical spine tenderness to palpation. Hematological/Lymphatic/Immunilogical: No cervical lymphadenopathy. Cardiovascular: Normal rate, regular rhythm. Grossly normal heart sounds.  Good peripheral circulation. Respiratory: Normal respiratory effort.  No retractions. Lungs CTAB. Gastrointestinal: Soft and nontender. No distention. No abdominal bruits. No CVA tenderness. Genitourinary: Deferred Musculoskeletal: No lower extremity tenderness nor edema.  No joint effusions. Neurologic:  Normal speech and language. No gross focal neurologic deficits are appreciated. No gait instability. Skin:  Skin is warm, dry and intact. No rash noted.  Cystic lesion left upper back Psychiatric: Mood and affect are normal. Speech and behavior are normal.  ____________________________________________   LABS        Component Ref Range & Units 7 d ago 12 mo ago 2 yr ago  Color, UA  yellow yellow Dark Yellow  Clarity, UA  clear clear Clear  Glucose, UA Negative Negative Negative Negative  Bilirubin, UA  neg negative Negative  Ketones, UA  neg negatibe Negative  Spec Grav, UA 1.010 - 1.025 >=1.030 Abnormal  1.010 1.025  Blood, UA  neg negative Negative  pH, UA 5.0 - 8.0 6.0 7.5 6.0  Protein, UA Negative Negative Negative Negative  Urobilinogen, UA 0.2 or 1.0 E.U./dL 0.2 0.2 0.2  Nitrite, UA  neg negative Negative  Leukocytes, UA Negative Negative Negative Negative  Appearance   normal   Odor  Component Ref Range & Units 7 d ago 12 mo ago 2 yr ago  Glucose 70 - 99 mg/dL 93 88 77 R  Uric Acid 3.8 - 8.4 mg/dL 5.5 6.0 CM 5.8 CM  Comment:            Therapeutic target for gout patients: <6.0  BUN 6 - 20 mg/dL 16 15 14  $ Creatinine, Ser 0.76 - 1.27  mg/dL 0.78 0.90 0.91  eGFR >59 mL/min/1.73 126 122   BUN/Creatinine Ratio 9 - 20 21 High  17 15  Sodium 134 - 144 mmol/L 142 141 141  Potassium 3.5 - 5.2 mmol/L 4.4 4.6 4.7  Chloride 96 - 106 mmol/L 106 104 104  Calcium 8.7 - 10.2 mg/dL 9.1 9.5 9.6  Phosphorus 2.8 - 4.1 mg/dL 3.3 3.1 3.2  Total Protein 6.0 - 8.5 g/dL 6.6 7.1 6.9  Albumin 4.3 - 5.2 g/dL 4.4 4.7 R 4.7 R  Globulin, Total 1.5 - 4.5 g/dL 2.2 2.4 2.2  Albumin/Globulin Ratio 1.2 - 2.2 2.0 2.0 2.1  Bilirubin Total 0.0 - 1.2 mg/dL 0.4 0.8 0.6  Alkaline Phosphatase 44 - 121 IU/L 55 55 61  LDH 121 - 224 IU/L 150 159 201  AST 0 - 40 IU/L 28 23 26  $ ALT 0 - 44 IU/L 43 29 27  GGT 0 - 65 IU/L 30 28 23  $ Iron 38 - 169 ug/dL 72 107 139  Cholesterol, Total 100 - 199 mg/dL 88 Low  131 129  Triglycerides 0 - 149 mg/dL 49 81 167 High   HDL >39 mg/dL 28 Low  36 Low  30 Low   VLDL Cholesterol Cal 5 - 40 mg/dL 12 16 29  $ LDL Chol Calc (NIH) 0 - 99 mg/dL 48 79 70  Chol/HDL Ratio 0.0 - 5.0 ratio 3.1 3.6 CM 4.3 CM  Comment:                                   T. Chol/HDL Ratio                                             Men  Women                               1/2 Avg.Risk  3.4    3.3                                   Avg.Risk  5.0    4.4                                2X Avg.Risk  9.6    7.1                                3X Avg.Risk 23.4   11.0  Estimated CHD Risk 0.0 - 1.0 times avg.  < 0.5 0.6 CM 0.8 CM  Comment: The CHD Risk is based on the T. Chol/HDL ratio. Other factors affect CHD Risk such as hypertension, smoking, diabetes, severe obesity, and family history of premature  CHD.  TSH 0.450 - 4.500 uIU/mL 1.140 1.000 1.170  T4, Total 4.5 - 12.0 ug/dL 7.0 6.8 7.7  T3 Uptake Ratio 24 - 39 % 30 30 28  $ Free Thyroxine Index 1.2 - 4.9 2.1 2.0 2.2  WBC 3.4 - 10.8 x10E3/uL 5.2 6.4 7.6  RBC 4.14 - 5.80 x10E6/uL 5.30 5.95 High  5.76  Hemoglobin 13.0 - 17.7 g/dL 15.2 16.8 16.2  Hematocrit 37.5 - 51.0 % 45.4 49.6 48.3  MCV 79 - 97 fL 86 83 84   MCH 26.6 - 33.0 pg 28.7 28.2 28.1  MCHC 31.5 - 35.7 g/dL 33.5 33.9 33.5  RDW 11.6 - 15.4 % 13.0 12.9 13.0  Platelets 150 - 450 x10E3/uL 325 368 387  Neutrophils Not Estab. % 48 53 55  Lymphs Not Estab. % 39 38 35  Monocytes Not Estab. % 11 7 8  $ Eos Not Estab. % 1 1 1  $ Basos Not Estab. % 1 1 1  $ Neutrophils Absolute 1.4 - 7.0 x10E3/uL 2.5 3.4 4.2  Lymphocytes Absolute 0.7 - 3.1 x10E3/uL 2.1 2.4 2.7  Monocytes Absolute 0.1 - 0.9 x10E3/uL 0.6 0.5 0.6  EOS (ABSOLUTE) 0.0 - 0.4 x10E3/uL 0.0 0.0 0.0  Basophils Absolute 0.0 - 0.2 x10E3/uL 0.0 0.1 0.1  Immature Granulocytes Not Estab. % 0 0 0            ____________________________________________     ____________________________________________    ____________________________________________   INITIAL IMPRESSION / ASSESSMENT AND PLAN  As part of my medical decision making, I reviewed the following data within the electronic MEDICAL RECORD NUMBER       Discussed no acute findings on physical exam and lab results.  Patient was schedule appointment ear irrigation and excision and drainage of sebaceous cyst left upper back.     ____________________________________________   FINAL CLINICAL IMPRESSION Well exam   ED Discharge Orders     None        Note:  This document was prepared using Dragon voice recognition software and may include unintentional dictation errors.

## 2022-08-28 ENCOUNTER — Ambulatory Visit: Payer: Self-pay

## 2022-08-28 ENCOUNTER — Encounter: Payer: Self-pay | Admitting: Physician Assistant

## 2022-08-28 ENCOUNTER — Ambulatory Visit: Payer: Self-pay | Admitting: Physician Assistant

## 2022-08-28 DIAGNOSIS — H6120 Impacted cerumen, unspecified ear: Secondary | ICD-10-CM

## 2022-08-28 DIAGNOSIS — L723 Sebaceous cyst: Secondary | ICD-10-CM

## 2022-08-28 MED ORDER — SULFAMETHOXAZOLE-TRIMETHOPRIM 800-160 MG PO TABS: 1.0000 | ORAL_TABLET | Freq: Two times a day (BID) | ORAL | 0 refills | Status: DC

## 2022-08-28 NOTE — Progress Notes (Signed)
   Subjective: Skin lesion    Patient ID: Justin Miles, male    DOB: 1995-08-15, 27 y.o.   MRN: [id number]  HPI Patient here today for evaluation of skin lesion posterior left upper back for 2 to 3 months.  Lesions consistent with sebaceous cyst.  Patient is amenable for removal.   Review of Systems Negative septa chief complaint    Objective:   Physical Exam See nurses notes and vital signs. Nodule lesion left upper back.       Assessment & Plan: Sebaceous cyst.   Advised patient consent. Area was surgically prepped.  3 mL of 1% lidocaine with epinephrine for topical block.  Area was incised using #10 blade.  Sebaceous material was removed intact with sac.  Area was copiously irrigated.  Area was closed with 3 interrupted 3-0 Prolene.  Bacitracin was applied and area was bandaged.  Patient given discharge care instructions for wound care.  Patient given a prescription for Bactrim DS.  Patient advised return back in 10 days for suture removal.  Patient advised return back sooner signs symptoms of secondary infection develop.

## 2022-08-28 NOTE — Progress Notes (Signed)
Pt presents today for ear lavage due to cerumen impaction in both ears. Ears are flushed and cleared, pt tolerated well. Per Ron smith,PA-C pt is cleared.

## 2022-08-28 NOTE — Progress Notes (Signed)
Pt presents today for cyst removal on back. Pt has no concerns.

## 2022-09-07 ENCOUNTER — Ambulatory Visit: Payer: Self-pay | Admitting: Physician Assistant

## 2022-09-07 ENCOUNTER — Encounter: Payer: Self-pay | Admitting: Physician Assistant

## 2022-09-07 DIAGNOSIS — Z4802 Encounter for removal of sutures: Secondary | ICD-10-CM

## 2022-09-07 NOTE — Progress Notes (Signed)
Pt presents today for f/u on cyst on back, stitch removal.

## 2022-09-07 NOTE — Progress Notes (Signed)
   Subjective: Suture removal    Patient ID: Justin Miles, male    DOB: 05/15/1996, 27 y.o.   MRN: [id number]  HPI Patient is here today for 10-day follow-up removal of sebaceous cyst left upper back 10 days ago.  Patient voiced no concerns or complaints.  Patient has finished antibiotics.   Review of Systems Negative except for chief complaint    Objective:   Physical Exam Vital signs deferred. No signs and symptoms secondary infection.  3 interrupted sutures visible.       Assessment & Plan: Suture removal   Area was surgically prepped and 3 interrupted sutures were removed.  Areas reclean and bandage and patient given discharge care instructions.

## 2022-09-18 ENCOUNTER — Emergency Department: Payer: 59

## 2022-09-18 ENCOUNTER — Other Ambulatory Visit: Payer: Self-pay

## 2022-09-18 ENCOUNTER — Encounter: Payer: Self-pay | Admitting: *Deleted

## 2022-09-18 DIAGNOSIS — S99922A Unspecified injury of left foot, initial encounter: Secondary | ICD-10-CM | POA: Diagnosis present

## 2022-09-18 DIAGNOSIS — M79675 Pain in left toe(s): Secondary | ICD-10-CM | POA: Diagnosis not present

## 2022-09-18 DIAGNOSIS — Y9351 Activity, roller skating (inline) and skateboarding: Secondary | ICD-10-CM | POA: Insufficient documentation

## 2022-09-18 DIAGNOSIS — M7989 Other specified soft tissue disorders: Secondary | ICD-10-CM | POA: Diagnosis not present

## 2022-09-18 DIAGNOSIS — S92415A Nondisplaced fracture of proximal phalanx of left great toe, initial encounter for closed fracture: Secondary | ICD-10-CM | POA: Diagnosis not present

## 2022-09-18 NOTE — ED Triage Notes (Signed)
Pt has left great toe pain.  Pt injured today w hile skating.  Pt fell.  Pt alert.

## 2022-09-19 ENCOUNTER — Telehealth: Payer: Self-pay | Admitting: Physician Assistant

## 2022-09-19 ENCOUNTER — Emergency Department
Admission: EM | Admit: 2022-09-19 | Discharge: 2022-09-19 | Disposition: A | Discharge status: Home / Self Care | Payer: 59 | Attending: Emergency Medicine | Admitting: Emergency Medicine

## 2022-09-19 ENCOUNTER — Telehealth: Payer: Self-pay

## 2022-09-19 DIAGNOSIS — S92415A Nondisplaced fracture of proximal phalanx of left great toe, initial encounter for closed fracture: Secondary | ICD-10-CM

## 2022-09-19 MED ORDER — HYDROCODONE-ACETAMINOPHEN 5-325 MG PO TABS: 1.0000 | ORAL_TABLET | Freq: Three times a day (TID) | ORAL | 0 refills | Status: DC | PRN

## 2022-09-19 NOTE — Transitions of Care (Post Inpatient/ED Visit) (Signed)
   09/19/2022  Name: Justin Miles MRN: [id number] DOB: Dec 25, 1995  Today's TOC FU Call Status: Today's TOC FU Call Status:: Successful TOC FU Call Competed TOC FU Call Complete Date: 09/19/22  Transition Care Management Follow-up Telephone Call Date of Discharge: 09/18/22 Discharge Facility: [facility]) Type of Discharge: Emergency Department Reason for ED Visit: Other: (Nondisplaced fracture of proximal phalanx of left great toe) How have you been since you were released from the hospital?: Better Any questions or concerns?: No  Items Reviewed: Did you receive and understand the discharge instructions provided?: Yes Medications obtained and verified?: Yes (Medications Reviewed) Any new allergies since your discharge?: No Dietary orders reviewed?: Yes Do you have support at home?: Yes  Home Care and Equipment/Supplies: [place] Ordered?: No Any new equipment or medical supplies ordered?: No  Functional Questionnaire: Do you need assistance with bathing/showering or dressing?: No Do you need assistance with meal preparation?: No Do you need assistance with eating?: No Do you have difficulty maintaining continence: No Do you need assistance with getting out of bed/getting out of a chair/moving?: No Do you have difficulty managing or taking your medications?: No  Folllow up appointments reviewed: PCP Follow-up appointment confirmed?: No (specialist) MD Provider Line [phone] Given: Yes [facility] Follow-up appointment confirmed?: Yes Date of Specialist follow-up appointment?: 09/20/22 Follow-Up Specialty Provider:: Dr Milinda Pointer Do you need transportation to your follow-up appointment?: No Do you understand care options if your condition(s) worsen?: Yes-patient verbalized understanding    [place] LPN [facility] [phone]

## 2022-09-19 NOTE — Discharge Instructions (Addendum)
Please take Tylenol and ibuprofen/Advil for your pain.  It is safe to take them together, or to alternate them every few hours.  Take up to '1000mg'$  of Tylenol at a time, up to 4 times per day.  Do not take more than 4000 mg of Tylenol in 24 hours.  For ibuprofen, take 400-600 mg, 3 - 4 times per day.  Wear the stiff shoe until cleared by podiatry

## 2022-09-19 NOTE — ED Notes (Signed)
Pt was evaluation and d/c by Dr Charlsie Quest in triage

## 2022-09-19 NOTE — Telephone Encounter (Cosign Needed)
Patient called to request pain medicine prescription he was initially offered last night, for his foot fracture. He declined the RX at the time, citing his anxiety. He called today to ask that the RX be sent to his pharmacy.

## 2022-09-19 NOTE — ED Provider Notes (Signed)
[facility] Provider Note    None    (approximate)   History   Foot Pain   HPI  Justin Miles is a 27 y.o. male who presents to the ED for evaluation of Foot Pain   Patient presents with left great toe pain after he fell off a skateboard.  Difficulty walking due to the pain.  No other injuries, head trauma or syncope.   Physical Exam   Triage Vital Signs: ED Triage Vitals  Enc Vitals Group     BP 09/18/22 2115 (!) 144/96     Pulse Rate 09/18/22 2115 73     Resp 09/18/22 2115 18     Temp 09/18/22 2115 98 F (36.7 C)     Temp Source 09/18/22 2115 Oral     SpO2 09/18/22 2115 96 %     Weight 09/18/22 2114 224 lb 13.9 oz (102 kg)     Height 09/18/22 2114 '5\' 9"'$  (1.753 m)     Head Circumference --      Peak Flow --      Pain Score 09/18/22 2114 2     Pain Loc --      Pain Edu? --      Excl. in [place]? --     Most recent vital signs: Vitals:   09/18/22 2115  BP: (!) 144/96  Pulse: 73  Resp: 18  Temp: 98 F (36.7 C)  SpO2: 96%    General: Awake, no distress.  CV:  Good peripheral perfusion.  Resp:  Normal effort.  Abd:  No distention.  MSK:  No deformity noted.  Mild soft tissue swelling with significant tenderness over the proximal phalanx of the left great toe.  No laceration or evidence of open injury. Neuro:  No focal deficits appreciated. Other:     ED Results / Procedures / Treatments   Labs (all labs ordered are listed, but only abnormal results are displayed) Labs Reviewed - No data to display  EKG   RADIOLOGY Plain film of the left foot interpreted by me with comminuted fracture of the left proximal phalanx of the great toe  Official radiology report(s): DG Foot Complete Left  Result Date: 09/18/2022 CLINICAL DATA:  Injury with left toe pain. EXAM: LEFT FOOT - COMPLETE 3+ VIEW COMPARISON:  None. FINDINGS: There is an acute comminuted fracture of the mid and distal first proximal phalanx with intra-articular extension  into the interphalangeal joint. There is mild apex plantar angulation and fracture fragments are distracted up to 4 mm. There is surrounding soft tissue swelling. There is no dislocation. IMPRESSION: Acute comminuted fracture of the first proximal phalanx with intra-articular extension into the interphalangeal joint. Electronically Signed   By: Ronney Asters M.D.   On: 09/18/2022 21:39    PROCEDURES and INTERVENTIONS:  Procedures  Medications - No data to display   IMPRESSION / MDM / [place] / ED COURSE  I reviewed the triage vital signs and the nursing notes.  Differential diagnosis includes, but is not limited to, open fracture, closed fracture, dislocation  27 year old male presents with a comminuted fracture of his great toe after falling off a skateboard.  Closed injury suitable for outpatient management.  We provide him a stiff postop shoe and follow-up information for podiatry.  We discussed RICE therapy, multimodal analgesia and return precautions.      FINAL CLINICAL IMPRESSION(S) / ED DIAGNOSES   Final diagnoses:  Nondisplaced fracture of proximal phalanx of left great toe,  initial encounter for closed fracture     Rx / DC Orders   ED Discharge Orders     None        Note:  This document was prepared using Dragon voice recognition software and may include unintentional dictation errors.   Vladimir Crofts, MD 09/19/22 [phone]

## 2022-09-20 ENCOUNTER — Ambulatory Visit (INDEPENDENT_AMBULATORY_CARE_PROVIDER_SITE_OTHER): Payer: 59 | Admitting: Podiatry

## 2022-09-20 ENCOUNTER — Encounter: Payer: Self-pay | Admitting: Podiatry

## 2022-09-20 DIAGNOSIS — S92405A Nondisplaced unspecified fracture of left great toe, initial encounter for closed fracture: Secondary | ICD-10-CM | POA: Diagnosis not present

## 2022-09-20 NOTE — Progress Notes (Signed)
  Subjective:  Patient ID: Justin Miles, male    DOB: Apr 19, 1996,  MRN: [id number] HPI Chief Complaint  Patient presents with   Toe Pain    Hallux left - injury to toe skateboarding 09/18/22, jammed toe, went ER, xrayed, said fractured, put in surgical shoe, tender   New Patient (Initial Visit)    27 y.o. male presents with the above complaint.   ROS: Denies fever chills nausea vomit muscle aches pains calf pain back pain chest pain shortness of breath  States that he injured his toe on 311 while skateboarding he went to the ED and was x-rayed was told he had a fracture proximal phalanx.  States that the toe is tender  No past medical history on file. Past Surgical History:  Procedure Laterality Date   ELBOW SURGERY Right     Current Outpatient Medications:    Acetaminophen (TYLENOL 8 HOUR PO), Take by mouth., Disp: , Rfl:    ibuprofen (ADVIL) 200 MG tablet, Take 200 mg by mouth every 6 (six) hours as needed., Disp: , Rfl:   No Known Allergies Review of Systems Objective:  There were no vitals filed for this visit.  General: Well developed, nourished, in no acute distress, alert and oriented x3   Dermatological: Skin is warm, dry and supple bilateral. Nails x 10 are well maintained; remaining integument appears unremarkable at this time. There are no open sores, no preulcerative lesions, no rash or signs of infection present.  Vascular: Dorsalis Pedis artery and Posterior Tibial artery pedal pulses are 2/4 bilateral with immedate capillary fill time. Pedal hair growth present. No varicosities and no lower extremity edema present bilateral.   Neruologic: Grossly intact via light touch bilateral. Vibratory intact via tuning fork bilateral. Protective threshold with Semmes Wienstein monofilament intact to all pedal sites bilateral. Patellar and Achilles deep tendon reflexes 2+ bilateral. No Babinski or clonus noted bilateral.   Musculoskeletal: No gross boney pedal deformities  bilateral. No pain, crepitus, or limitation noted with foot and ankle range of motion bilateral. Muscular strength 5/5 in all groups tested bilateral.  Hallux right demonstrates a nice rectus normal appearing hallux with good range of motion.  Hallux left does demonstrate edema erythema and ecchymosis with lateral deviation of the distal portion of the hallux.  Gait: Unassisted, Nonantalgic.    Radiographs:  Radiographs were reviewed that were taken at the ED dated March 11.  They demonstrated a divergent gap from the plantar aspect of the hallux dorsally also appears to be a comminuted fracture at the distal condyle that is intra-articular at the level of the interphalangeal joint.  Assessment & Plan:   Assessment: Fracture proximal phalanx comminuted hallux left  Plan: Due to the angular deformity I feel is necessary to correct this.  Also due to his age.  So we did discuss opening this for reduction as well as a pinning.  He understands this is amenable to it we will do this at surgery center in [place] with IV sedation and local anesthetic.  We discussed pros and cons of this explained to him that is going to be pinned and the pins will be external and small to be 6 to 8 weeks before he will be able to return to work he understands this is amenable to it we will follow-up with me in the near future currently wearing her Darco shoe may place him in a cam boot at the time of surgery.     Tabbitha Janvrin T. [place], [place]

## 2022-09-21 ENCOUNTER — Other Ambulatory Visit: Payer: Self-pay

## 2022-09-21 ENCOUNTER — Telehealth: Payer: Self-pay | Admitting: Physician Assistant

## 2022-09-21 MED ORDER — HYDROCODONE-ACETAMINOPHEN 5-325 MG PO TABS
1.0000 | ORAL_TABLET | Freq: Three times a day (TID) | ORAL | 0 refills | Status: AC | PRN
  Filled 2022-09-21: qty 9, 3d supply, fill #0

## 2022-09-21 NOTE — Telephone Encounter (Cosign Needed)
Patient called the ED to notify the provider that [facility] did not have the prescription medicine available in stock.  They were unable to transfer the prescription to another pharmacy.  Prescription was rerouted to [facility] pharmacy as requested.

## 2022-09-22 ENCOUNTER — Telehealth: Payer: Self-pay | Admitting: Urology

## 2022-09-22 ENCOUNTER — Telehealth: Payer: Self-pay | Admitting: Podiatry

## 2022-09-22 NOTE — Telephone Encounter (Signed)
DOS - 09/29/22  OPEN REDUCTION PROXIMAL PHALANX 1ST LEFT --- [id number]  [employer]   PER [employer] WEBSITE FOR CPT CODE 29562 NO PRIOR AUTH IS REQUIRED.  Transaction ID: Not Found

## 2022-09-22 NOTE — Telephone Encounter (Signed)
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 It would be no walking, just to and from his car to the office.  Please Advise

## 2022-09-25 ENCOUNTER — Telehealth: Payer: Self-pay | Admitting: *Deleted

## 2022-09-25 NOTE — Telephone Encounter (Signed)
Patient is calling and wanting to go back to work until Friday (upcoming surgery)if possible, is just sitting around until that time, please advise.

## 2022-09-26 ENCOUNTER — Encounter: Payer: Self-pay | Admitting: Podiatry

## 2022-09-27 ENCOUNTER — Other Ambulatory Visit: Payer: Self-pay | Admitting: Podiatry

## 2022-09-27 MED ORDER — ONDANSETRON HCL 4 MG PO TABS: 4.0000 mg | ORAL_TABLET | Freq: Three times a day (TID) | ORAL | 0 refills | Status: DC | PRN

## 2022-09-27 MED ORDER — CEPHALEXIN 500 MG PO CAPS: 500.0000 mg | ORAL_CAPSULE | Freq: Three times a day (TID) | ORAL | 0 refills | Status: DC

## 2022-09-27 MED ORDER — OXYCODONE-ACETAMINOPHEN 10-325 MG PO TABS: 1.0000 | ORAL_TABLET | Freq: Three times a day (TID) | ORAL | 0 refills | Status: AC | PRN

## 2022-09-29 DIAGNOSIS — S92405A Nondisplaced unspecified fracture of left great toe, initial encounter for closed fracture: Secondary | ICD-10-CM

## 2022-09-29 DIAGNOSIS — X58XXXA Exposure to other specified factors, initial encounter: Secondary | ICD-10-CM | POA: Diagnosis not present

## 2022-09-29 DIAGNOSIS — Y929 Unspecified place or not applicable: Secondary | ICD-10-CM | POA: Diagnosis not present

## 2022-09-29 DIAGNOSIS — S92052A Displaced other extraarticular fracture of left calcaneus, initial encounter for closed fracture: Secondary | ICD-10-CM | POA: Diagnosis not present

## 2022-09-29 DIAGNOSIS — S92412A Displaced fracture of proximal phalanx of left great toe, initial encounter for closed fracture: Secondary | ICD-10-CM | POA: Diagnosis not present

## 2022-10-04 ENCOUNTER — Ambulatory Visit (INDEPENDENT_AMBULATORY_CARE_PROVIDER_SITE_OTHER): Payer: 59 | Admitting: Podiatry

## 2022-10-04 ENCOUNTER — Ambulatory Visit (INDEPENDENT_AMBULATORY_CARE_PROVIDER_SITE_OTHER): Payer: 59

## 2022-10-04 DIAGNOSIS — S92405D Nondisplaced unspecified fracture of left great toe, subsequent encounter for fracture with routine healing: Secondary | ICD-10-CM

## 2022-10-04 DIAGNOSIS — S92405A Nondisplaced unspecified fracture of left great toe, initial encounter for closed fracture: Secondary | ICD-10-CM

## 2022-10-05 NOTE — Progress Notes (Signed)
  Subjective:  Patient ID: Justin Miles, male    DOB: 18-Oct-1995,  MRN: [id number]  Chief Complaint  Patient presents with   Routine Post Op    POV #1 DOS 09/29/2022 LT FOOT OPEN REDUCTION HALLUX WITH PINNING/DR HYATT PT     27 y.o. male returns for post-op check.  He is doing well and much pain  Review of Systems: Negative except as noted in the HPI. Denies N/V/F/Ch.   Objective:  There were no vitals filed for this visit. There is no height or weight on file to calculate BMI. Constitutional Well developed. Well nourished.  Vascular Foot warm and well perfused. Capillary refill normal to all digits.  Calf is soft and supple, no posterior calf or knee pain, negative Homans' sign  Neurologic Normal speech. Oriented to person, place, and time. Epicritic sensation to light touch grossly present bilaterally.  Dermatologic Skin healing well without signs of infection. Skin edges well coapted without signs of infection.  Orthopedic: Tenderness to palpation noted about the surgical site.   Multiple view plain film radiographs: Status post ORIF left hallux fracture with internal fixation Assessment:   1. Closed nondisplaced fracture of phalanx of left great toe with routine healing, unspecified phalanx, subsequent encounter    Plan:  Patient was evaluated and treated and all questions answered.  S/p foot surgery left -Progressing as expected post-operatively. -XR: Noted above no complication noted -WB Status: NWB in CAM boot with crutches -Sutures: Return in 2 weeks for removal. -Medications: No refills required -Foot redressed.  No follow-ups on file.

## 2022-10-18 ENCOUNTER — Ambulatory Visit (INDEPENDENT_AMBULATORY_CARE_PROVIDER_SITE_OTHER): Payer: 59 | Admitting: Podiatry

## 2022-10-18 ENCOUNTER — Encounter: Payer: Self-pay | Admitting: Podiatry

## 2022-10-18 DIAGNOSIS — S92405D Nondisplaced unspecified fracture of left great toe, subsequent encounter for fracture with routine healing: Secondary | ICD-10-CM

## 2022-10-18 DIAGNOSIS — Z9889 Other specified postprocedural states: Secondary | ICD-10-CM

## 2022-10-18 NOTE — Progress Notes (Signed)
He presents today for follow-up of his open reduction hallux left foot.  He denies fever chills nausea vomit states that he has been nonweightbearing with his cam boot and crutches.  Objective: Vital signs stable alert oriented times 3 sutures are intact margins well coapted sutures removed today margins remain well coapted his nice rectus toe.  There is some mild edema.  Assessment well-healing surgical toe.  Plan: Redressed with a compression dressing demonstrated to him how to do this.  Will him to continue nonweightbearing status for at least the next 3 weeks for another set of x-rays to be taken.

## 2022-10-24 ENCOUNTER — Telehealth: Payer: Self-pay | Admitting: Podiatry

## 2022-10-24 NOTE — Telephone Encounter (Signed)
Patient came into [facility] office this am stating he needs a letter for work with light duty and what his restrictions are. Pt would like it sent to my chart or he can come get it.

## 2022-10-25 ENCOUNTER — Encounter: Payer: Self-pay | Admitting: Podiatry

## 2022-10-25 NOTE — Telephone Encounter (Signed)
Note written and patient picked it up.

## 2022-11-08 ENCOUNTER — Encounter: Payer: 59 | Admitting: Podiatry

## 2022-11-13 ENCOUNTER — Ambulatory Visit (INDEPENDENT_AMBULATORY_CARE_PROVIDER_SITE_OTHER): Payer: 59 | Admitting: Podiatry

## 2022-11-13 ENCOUNTER — Encounter: Payer: Self-pay | Admitting: Podiatry

## 2022-11-13 ENCOUNTER — Ambulatory Visit (INDEPENDENT_AMBULATORY_CARE_PROVIDER_SITE_OTHER): Payer: 59

## 2022-11-13 ENCOUNTER — Encounter: Payer: 59 | Admitting: Podiatry

## 2022-11-13 DIAGNOSIS — S92405D Nondisplaced unspecified fracture of left great toe, subsequent encounter for fracture with routine healing: Secondary | ICD-10-CM

## 2022-11-13 DIAGNOSIS — Z9889 Other specified postprocedural states: Secondary | ICD-10-CM

## 2022-11-13 NOTE — Progress Notes (Signed)
He presents today for follow-up of his open reduction proximal phalanx hallux pinning states that he is it has been great he has had no problems he is working with his cam boot.  Date of surgery was 09/29/2022.  Objective: Vital signs are stable oriented x 3 he has the toe wrapped with Coban today.  Presents with his cam boot.  Once removed demonstrates no erythema edema salines drainage odor incisions on the heel 100%.  Radiographs taken today demonstrate screws are intact alignment is as good as it can get has been unchanged since time of surgery.  Peers to be healing very nicely.  Assessment well-healing surgical toe.  Plan: Discussed etiology pathology conservative surgical therapies at this point mobic to continue with stiff soled boots out of the cam walker or his work boots.  I will allow him to get back to work but he is not to go on his toes or squat down on his toes.  He understands this and is amenable to it number to follow-up with him on an as-needed basis.  Will write him a note to return to work full duty.

## 2022-11-15 ENCOUNTER — Encounter: Payer: 59 | Admitting: Podiatry

## 2022-11-21 ENCOUNTER — Encounter: Payer: Self-pay | Admitting: Physician Assistant

## 2022-11-21 ENCOUNTER — Ambulatory Visit: Payer: Self-pay | Admitting: Physician Assistant

## 2022-11-21 DIAGNOSIS — M25572 Pain in left ankle and joints of left foot: Secondary | ICD-10-CM

## 2022-11-21 MED ORDER — NAPROXEN 500 MG PO TABS: 500.0000 mg | ORAL_TABLET | Freq: Two times a day (BID) | ORAL | 0 refills | Status: DC

## 2022-11-21 NOTE — Progress Notes (Signed)
Pt presents with left ankle pain x 1 week, can not put pressure with out limping.

## 2022-11-21 NOTE — Progress Notes (Signed)
   Subjective: Left ankle pain    Patient ID: Justin Miles, male    DOB: 29-Aug-1995, 27 y.o.   MRN: [id number]  HPI Patient complains of left ankle pain status post discontinuation of her walking boot secondary to a closed nondisplaced fracture of the phalanx of the left great toe 3 months ago.  Patient states after discontinuing the walking boot developed pain to the left lateral ankle.  No other provocative incident for complaint.  No palliative measures for complaint.   Review of Systems Negative except for above complaint.    Objective:   Physical Exam No acute distress.  Reviewed previous x-rays.  Patient has internal fixations of the left great toe.  Patient also has small heel spur.  Patient has full and equal range of motion of the ankle and foot.  Surgical scar consistent with history to left toe fracture with internal fixation.  No edema or erythema.       Assessment & Plan: Left ankle pain  Discussed with patient that his discomfort is secondary to immobilization of the right foot/ankle for the past 3 months.  Advised to conservative therapy consisting of heat, stretching, and anti-inflammatory medications.  Patient advised follow-up 1 week if no improvement or worsening complaint.

## 2022-11-29 NOTE — Progress Notes (Signed)
This encounter was created in error - please disregard.

## 2023-01-26 DIAGNOSIS — H52223 Regular astigmatism, bilateral: Secondary | ICD-10-CM | POA: Diagnosis not present

## 2023-01-26 DIAGNOSIS — H5213 Myopia, bilateral: Secondary | ICD-10-CM | POA: Diagnosis not present

## 2023-06-21 ENCOUNTER — Encounter: Payer: Self-pay | Admitting: Physician Assistant

## 2023-08-20 ENCOUNTER — Ambulatory Visit: Payer: Self-pay

## 2023-08-20 DIAGNOSIS — Z0000 Encounter for general adult medical examination without abnormal findings: Secondary | ICD-10-CM

## 2023-08-20 NOTE — Progress Notes (Signed)
 Pt completed labs and hearing for physical Justin Miles

## 2023-08-21 LAB — CMP12+LP+TP+TSH+6AC+CBC/D/PLT
ALT: 18 [IU]/L (ref 0–44)
AST: 17 [IU]/L (ref 0–40)
Albumin: 4.6 g/dL (ref 4.3–5.2)
Alkaline Phosphatase: 55 [IU]/L (ref 44–121)
BUN/Creatinine Ratio: 24 — ABNORMAL HIGH (ref 9–20)
BUN: 20 mg/dL (ref 6–20)
Basophils Absolute: 0.1 10*3/uL (ref 0.0–0.2)
Basos: 1 %
Bilirubin Total: 0.6 mg/dL (ref 0.0–1.2)
Calcium: 9.1 mg/dL (ref 8.7–10.2)
Chloride: 106 mmol/L (ref 96–106)
Chol/HDL Ratio: 2.8 {ratio} (ref 0.0–5.0)
Cholesterol, Total: 109 mg/dL (ref 100–199)
Creatinine, Ser: 0.84 mg/dL (ref 0.76–1.27)
EOS (ABSOLUTE): 0 10*3/uL (ref 0.0–0.4)
Eos: 1 %
Estimated CHD Risk: <0.5 times avg. (ref 0.0–1.0)
Free Thyroxine Index: 1.9 (ref 1.2–4.9)
GGT: 17 [IU]/L (ref 0–65)
Globulin, Total: 2.4 g/dL (ref 1.5–4.5)
Glucose: 86 mg/dL (ref 70–99)
HDL: 39 mg/dL — ABNORMAL LOW (ref >39)
Hematocrit: 48.8 % (ref 37.5–51.0)
Hemoglobin: 16.5 g/dL (ref 13.0–17.7)
Immature Grans (Abs): 0 10*3/uL (ref 0.0–0.1)
Immature Granulocytes: 0 %
Iron: 103 ug/dL (ref 38–169)
LDH: 152 [IU]/L (ref 121–224)
LDL Chol Calc (NIH): 58 mg/dL (ref 0–99)
Lymphocytes Absolute: 2.5 10*3/uL (ref 0.7–3.1)
Lymphs: 41 %
MCH: 29.5 pg (ref 26.6–33.0)
MCHC: 33.8 g/dL (ref 31.5–35.7)
MCV: 87 fL (ref 79–97)
Monocytes Absolute: 0.4 10*3/uL (ref 0.1–0.9)
Monocytes: 7 %
Neutrophils Absolute: 3.1 10*3/uL (ref 1.4–7.0)
Neutrophils: 50 %
Phosphorus: 3.3 mg/dL (ref 2.8–4.1)
Platelets: 368 10*3/uL (ref 150–450)
Potassium: 4.7 mmol/L (ref 3.5–5.2)
RBC: 5.6 x10E6/uL (ref 4.14–5.80)
RDW: 12.9 % (ref 11.6–15.4)
Sodium: 142 mmol/L (ref 134–144)
T3 Uptake Ratio: 29 % (ref 24–39)
T4, Total: 6.6 ug/dL (ref 4.5–12.0)
TSH: 0.996 u[IU]/mL (ref 0.450–4.500)
Total Protein: 7 g/dL (ref 6.0–8.5)
Triglycerides: 52 mg/dL (ref 0–149)
Uric Acid: 5.2 mg/dL (ref 3.8–8.4)
VLDL Cholesterol Cal: 12 mg/dL (ref 5–40)
WBC: 6.2 10*3/uL (ref 3.4–10.8)
eGFR: 123 mL/min/{1.73_m2} (ref >59)

## 2023-08-23 ENCOUNTER — Encounter: Payer: Self-pay | Admitting: Physician Assistant

## 2023-08-23 ENCOUNTER — Ambulatory Visit: Payer: Self-pay | Admitting: Physician Assistant

## 2023-08-23 VITALS — BP 119/68 | HR 70 | Temp 97.9°F | Resp 12 | Ht 69.0 in | Wt 217.0 lb

## 2023-08-23 DIAGNOSIS — Z0000 Encounter for general adult medical examination without abnormal findings: Secondary | ICD-10-CM

## 2023-08-23 NOTE — Progress Notes (Signed)
[facility]   None    (approximate)  I have reviewed the triage vital signs and the nursing notes.   HISTORY  Chief Complaint No chief complaint on file.  HPI Justin Miles is a 28 y.o. male patient presents for annual physical exam.  Patient voiced no concerns or complaints.        History reviewed. No pertinent past medical history.  Patient Active Problem List   Diagnosis Date Noted   Other conjunctivitis 01/02/2022   Snoring 04/22/2018    Past Surgical History:  Procedure Laterality Date   ELBOW SURGERY Right     Prior to Admission medications   Not on File    Allergies Patient has no known allergies.  Family History  Problem Relation Age of Onset   Diabetes Father    Hypertension Father     Social History Social History   Tobacco Use   Smoking status: Former    Types: Cigarettes   Smokeless tobacco: Never  Vaping Use   Vaping status: Former  Substance Use Topics   Alcohol use: Yes   Drug use: Never    Review of Systems Constitutional: No fever/chills Eyes: No visual changes. ENT: No sore throat. Cardiovascular: Denies chest pain. Respiratory: Denies shortness of breath. Gastrointestinal: No abdominal pain.  No nausea, no vomiting.  No diarrhea.  No constipation. Genitourinary: Negative for dysuria. Musculoskeletal: Negative for back pain. Skin: Negative for rash. Neurological: Negative for headaches, focal weakness or numbness.  ____________________________________________   PHYSICAL EXAM:  VITAL SIGNS: BP 119/68  BP Location Left Arm  Patient Position Sitting  Cuff Size Large  Pulse Rate 70  Temp 97.9 F (36.6 C)  Temp Source Oral  Weight 217 lb (98.4 kg)  Height 5\' 9"  (1.753 m)  Resp 12  SpO2 97 %      Tobacco   Smoking status: Former  Used: Cigarettes  Packs/day: 0  Counseling given? Not Answered  Smokeless status: Never  Reviewed: 08/23/2023  Other Vitals   BMI: 32.05  kg/m2  BSA: 2.19 m2   Constitutional: Alert and oriented. Well appearing and in no acute distress. Eyes: Conjunctivae are normal. PERRL. EOMI. Head: Atraumatic. Nose: No congestion/rhinnorhea. Mouth/Throat: Mucous membranes are moist.  Oropharynx non-erythematous. Neck: No stridor.  No cervical spine tenderness to palpation. Hematological/Lymphatic/Immunilogical: No cervical lymphadenopathy. Cardiovascular: Normal rate, regular rhythm. Grossly normal heart sounds.  Good peripheral circulation. Respiratory: Normal respiratory effort.  No retractions. Lungs CTAB. Gastrointestinal: Soft and nontender. No distention. No abdominal bruits. No CVA tenderness. Genitourinary: Deferred Musculoskeletal: No lower extremity tenderness nor edema.  No joint effusions. Neurologic:  Normal speech and language. No gross focal neurologic deficits are appreciated. No gait instability. Skin:  Skin is warm, dry and intact. No rash noted. Psychiatric: Mood and affect are normal. Speech and behavior are normal.  ____________________________________________   LABS   _______        Component Ref Range & Units (hover) 3 d ago (08/20/23) 1 yr ago (08/16/22) 1 yr ago (08/26/21) 3 yr ago (08/09/20)  Glucose 86 93 88 77 R  Uric Acid 5.2 5.5 CM 6.0 CM 5.8 CM  Comment:            Therapeutic target for gout patients: <6.0  BUN 20 16 15 14   Creatinine, Ser 0.84 0.78 0.90 0.91  eGFR 123 126 122   BUN/Creatinine Ratio 24 High  21 High  17 15  Sodium 142 142 141 141  Potassium  4.7 4.4 4.6 4.7  Chloride 106 106 104 104  Calcium 9.1 9.1 9.5 9.6  Phosphorus 3.3 3.3 3.1 3.2  Total Protein 7.0 6.6 7.1 6.9  Albumin 4.6 4.4 4.7 R 4.7 R  Globulin, Total 2.4 2.2 2.4 2.2  Bilirubin Total 0.6 0.4 0.8 0.6  Alkaline Phosphatase 55 55 55 61  LDH 152 150 159 201  AST 17 28 23 26   ALT 18 43 29 27  GGT 17 30 28 23   Iron 103 72 107 139  Cholesterol, Total 109 88 Low  131 129  Triglycerides 52 49 81 167 High   HDL 39  Low  28 Low  36 Low  30 Low   VLDL Cholesterol Cal 12 12 16 29   LDL Chol Calc (NIH) 58 48 79 70  Chol/HDL Ratio 2.8 3.1 CM 3.6 CM 4.3 CM  Comment:                                   T. Chol/HDL Ratio                                             Men  Women                               1/2 Avg.Risk  3.4    3.3                                   Avg.Risk  5.0    4.4                                2X Avg.Risk  9.6    7.1                                3X Avg.Risk 23.4   11.0  Estimated CHD Risk  < 0.5  < 0.5 CM 0.6 CM 0.8 CM  Comment: The CHD Risk is based on the T. Chol/HDL ratio. Other factors affect CHD Risk such as hypertension, smoking, diabetes, severe obesity, and family history of premature CHD.  TSH 0.996 1.140 1.000 1.170  T4, Total 6.6 7.0 6.8 7.7  T3 Uptake Ratio 29 30 30 28   Free Thyroxine Index 1.9 2.1 2.0 2.2  WBC 6.2 5.2 6.4 7.6  RBC 5.60 5.30 5.95 High  5.76  Hemoglobin 16.5 15.2 16.8 16.2  Hematocrit 48.8 45.4 49.6 48.3  MCV 87 86 83 84  MCH 29.5 28.7 28.2 28.1  MCHC 33.8 33.5 33.9 33.5  RDW 12.9 13.0 12.9 13.0  Platelets 368 325 368 387  Neutrophils 50 48 53 55  Lymphs 41 39 38 35  Monocytes 7 11 7 8   Eos 1 1 1 1   Basos 1 1 1 1   Neutrophils Absolute 3.1 2.5 3.4 4.2  Lymphocytes Absolute 2.5 2.1 2.4 2.7  Monocytes Absolute 0.4 0.6 0.5 0.6  EOS (ABSOLUTE) 0.0 0.0 0.0 0.0  Basophils Absolute 0.1 0.0 0.1 0.1  Immature Granulocytes 0 0 0 0  Immature Grans (Abs) 0.0 0.0 0.0 0.0  _________________________    ____________________________________________   INITIAL IMPRESSION / ASSESSMENT AND PLAN   As part of my medical decision making, I reviewed the following data within the electronic MEDICAL RECORD NUMBER       No acute findings of his exam and labs.     ____________________________________________   FINAL CLINICAL IMPRESSION( Well exam   ED Discharge Orders     None        Note:  This document was prepared using Dragon voice  recognition software and may include unintentional dictation errors.

## 2024-05-05 ENCOUNTER — Other Ambulatory Visit: Payer: Self-pay

## 2024-05-05 NOTE — Progress Notes (Signed)
 RANDOM UDS FOR CEMETARY AND GROUNDS. URINE UDS PENDING WITH [facility].

## 2024-06-12 ENCOUNTER — Other Ambulatory Visit: Payer: Self-pay
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
## 2024-09-16 ENCOUNTER — Encounter: Admitting: Physician Assistant
